# Patient Record
Sex: Female | Born: 1955 | Race: White | Hispanic: No | Marital: Married | Smoking: Never smoker
Health system: Southern US, Community
[De-identification: ages and names within clinical notes are randomized; demographics above are authoritative.]

## PROBLEM LIST (undated history)

## (undated) DIAGNOSIS — E079 Disorder of thyroid, unspecified: Secondary | ICD-10-CM

## (undated) DIAGNOSIS — I82409 Acute embolism and thrombosis of unspecified deep veins of unspecified lower extremity: Secondary | ICD-10-CM

## (undated) DIAGNOSIS — I10 Essential (primary) hypertension: Secondary | ICD-10-CM

## (undated) HISTORY — DX: Disorder of thyroid, unspecified: E07.9

## (undated) HISTORY — DX: Essential (primary) hypertension: I10

## (undated) HISTORY — DX: Acute embolism and thrombosis of unspecified deep veins of unspecified lower extremity: I82.409

---
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
## 1988-01-21 HISTORY — PX: CHOLECYSTECTOMY: SHX55

## 1988-01-21 HISTORY — PX: APPENDECTOMY: SHX54

## 1995-01-21 HISTORY — PX: BREAST LUMPECTOMY: SHX2

## 1996-01-21 HISTORY — PX: ABDOMINAL HYSTERECTOMY: SHX81

## 2004-01-21 HISTORY — PX: LAPAROSCOPIC GASTRIC BANDING: SHX1100

## 2006-05-29 ENCOUNTER — Ambulatory Visit (HOSPITAL_COMMUNITY): Admission: RE | Admit: 2006-05-29 | Discharge: 2006-05-29 | Payer: Self-pay | Admitting: Surgery

## 2006-06-02 ENCOUNTER — Ambulatory Visit (HOSPITAL_COMMUNITY): Admission: RE | Admit: 2006-06-02 | Discharge: 2006-06-02 | Payer: Self-pay | Admitting: Surgery

## 2006-06-11 ENCOUNTER — Encounter: Admission: RE | Admit: 2006-06-11 | Discharge: 2006-06-11 | Payer: Self-pay | Admitting: Surgery

## 2006-10-06 ENCOUNTER — Encounter: Admission: RE | Admit: 2006-10-06 | Discharge: 2007-01-04 | Payer: Self-pay | Admitting: Surgery

## 2006-10-20 ENCOUNTER — Ambulatory Visit (HOSPITAL_COMMUNITY): Admission: RE | Admit: 2006-10-20 | Discharge: 2006-10-21 | Payer: Self-pay | Admitting: Surgery

## 2006-10-21 ENCOUNTER — Ambulatory Visit: Payer: Self-pay | Admitting: Vascular Surgery

## 2006-10-29 ENCOUNTER — Ambulatory Visit (HOSPITAL_COMMUNITY): Admission: RE | Admit: 2006-10-29 | Discharge: 2006-10-29 | Payer: Self-pay | Admitting: Radiation Oncology

## 2007-01-25 ENCOUNTER — Encounter: Admission: RE | Admit: 2007-01-25 | Discharge: 2007-01-25 | Payer: Self-pay | Admitting: Surgery

## 2007-04-05 ENCOUNTER — Encounter: Admission: RE | Admit: 2007-04-05 | Discharge: 2007-04-05 | Payer: Self-pay | Admitting: Surgery

## 2007-07-30 ENCOUNTER — Encounter: Admission: RE | Admit: 2007-07-30 | Discharge: 2007-07-30 | Payer: Self-pay | Admitting: Surgery

## 2007-11-04 ENCOUNTER — Ambulatory Visit (HOSPITAL_BASED_OUTPATIENT_CLINIC_OR_DEPARTMENT_OTHER): Admission: RE | Admit: 2007-11-04 | Discharge: 2007-11-04 | Payer: Self-pay | Admitting: Obstetrics and Gynecology

## 2008-05-09 ENCOUNTER — Ambulatory Visit: Payer: Self-pay | Admitting: Radiology

## 2008-05-09 ENCOUNTER — Emergency Department (HOSPITAL_BASED_OUTPATIENT_CLINIC_OR_DEPARTMENT_OTHER): Admission: EM | Admit: 2008-05-09 | Discharge: 2008-05-09 | Payer: Self-pay | Admitting: Emergency Medicine

## 2008-05-21 IMAGING — CT CT ANGIO CHEST
2 of 4 series · 19 of 36 positions shown · IV contrast (APPLIED)
Comparison: Chest radiograph 10/16/06.

CLINICAL DATA: 50-year-old female with right pleuritic chest pain.  History of DVT 20 years ago.  Status-post gastric banding 9 days ago.  
CT ANGIOGRAPHY OF CHEST:
TECHNIQUE: Multidetector CT imaging of the chest was performed during bolus injection of intravenous contrast.  Multiplanar CT angiographic image reconstructions were generated to evaluate the vascular anatomy.
Contrast:  80 cc Omnipaque 300

[Series 6: pe 1.0 b40f thins for pacs · axial · 0.74mm/px · z∈[-322,-124]mm · 16 of 219 slices shown]
[im 11/219  lung]
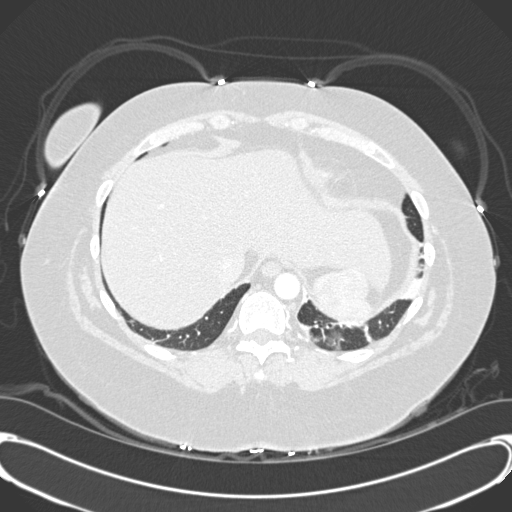
[im 22/219  mediastinal]
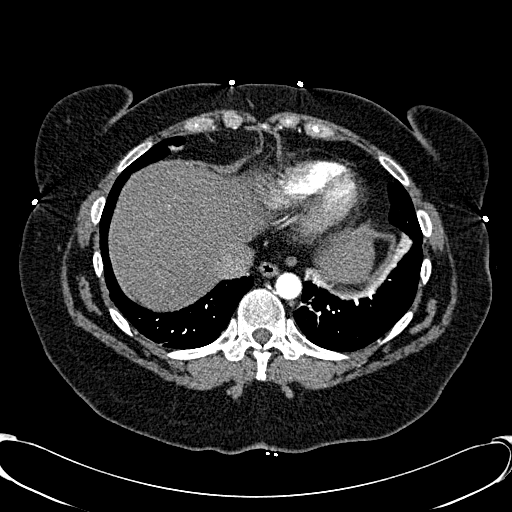
[im 33/219  lung]
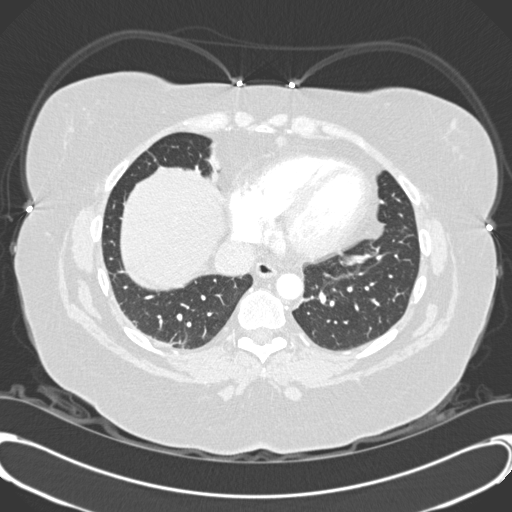
[im 55/219  mediastinal]
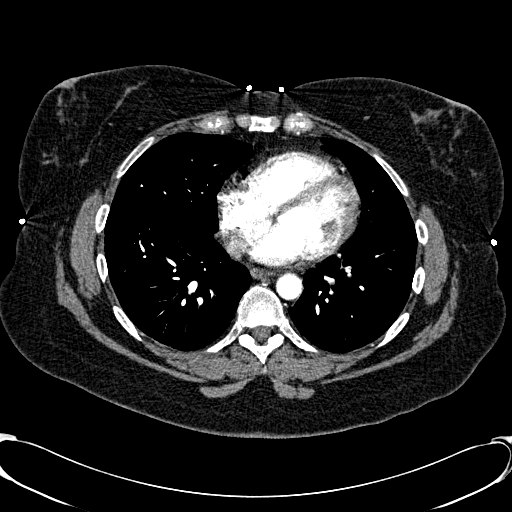
[im 66/219  lung]
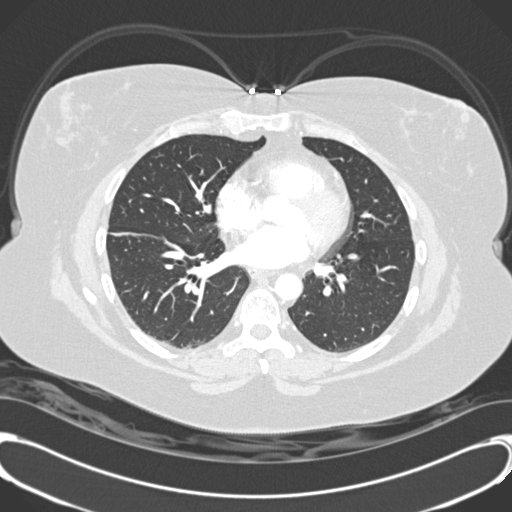
[im 77/219  mediastinal]
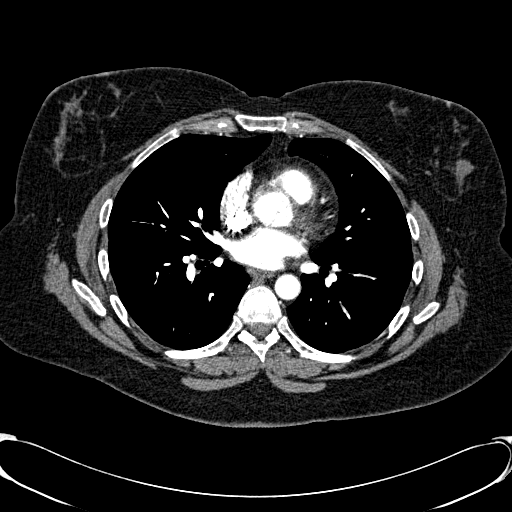
[im 88/219  lung]
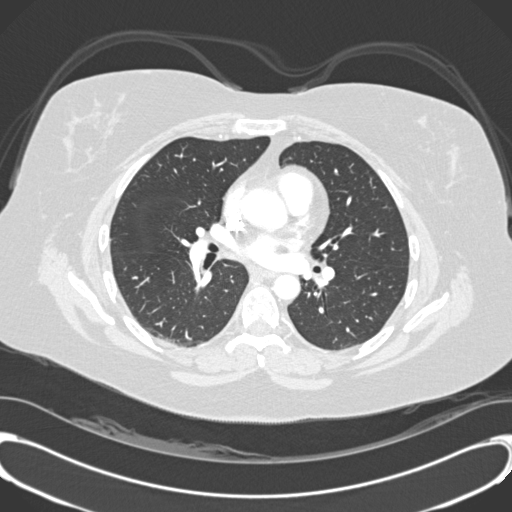
[im 99/219  mediastinal]
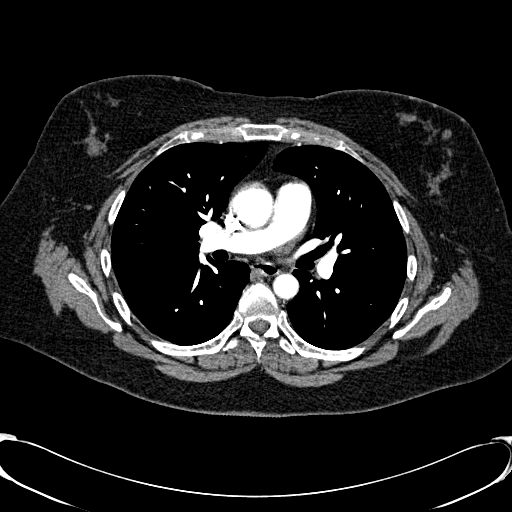
[im 120/219  lung]
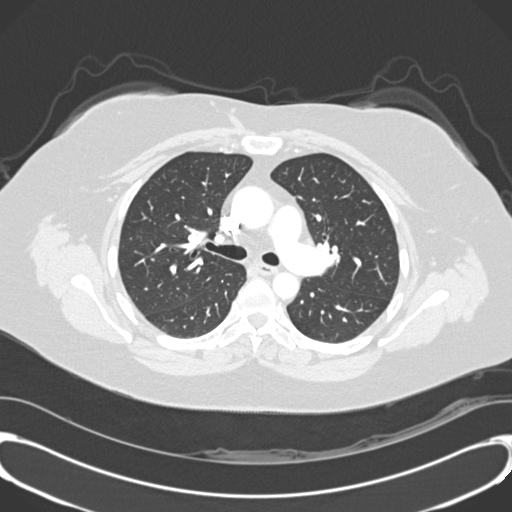
[im 131/219  mediastinal]
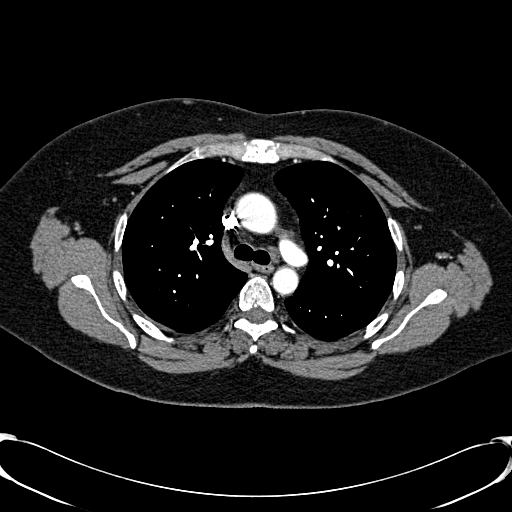
[im 142/219  lung]
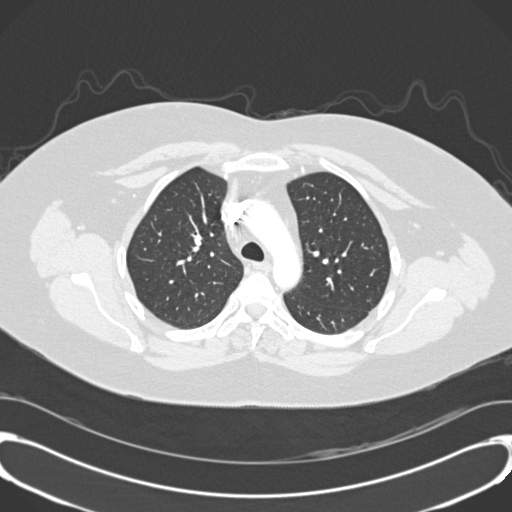
[im 153/219  mediastinal]
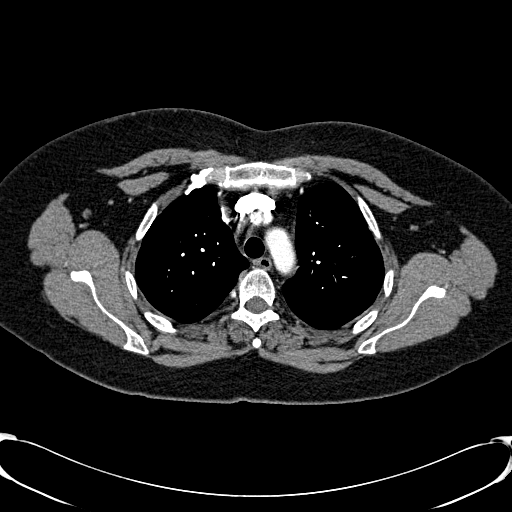
[im 164/219  lung]
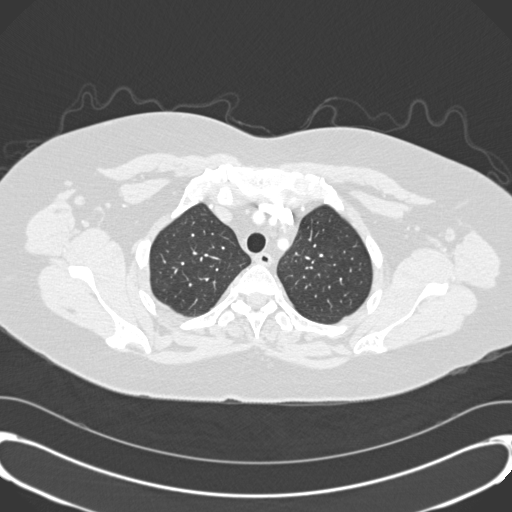
[im 186/219  mediastinal]
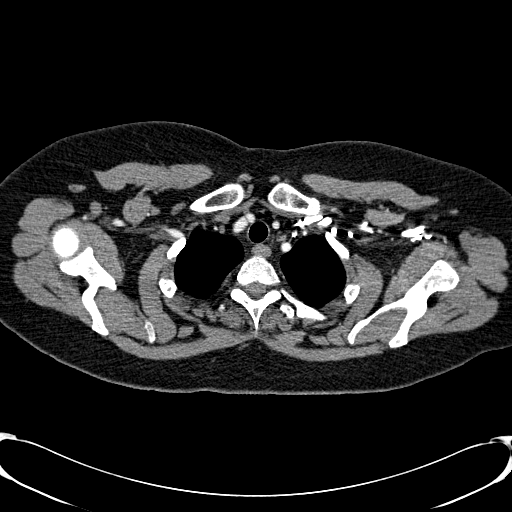
[im 197/219  lung]
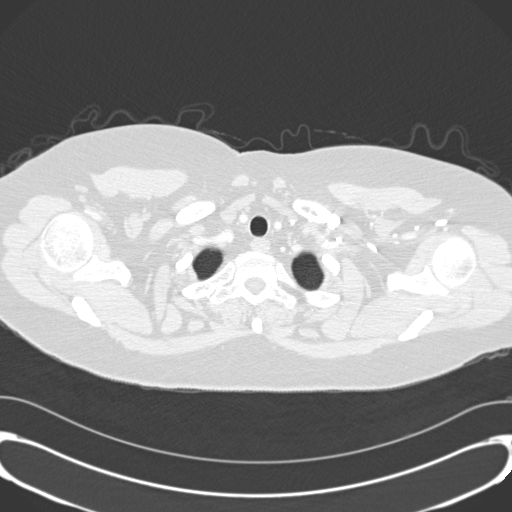
[im 208/219  mediastinal]
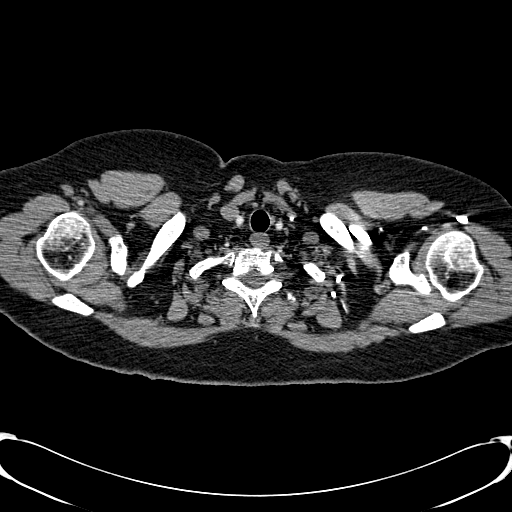

[Series 602: <mpr thick range> · coronal · 0.74mm/px · 3 of 60 slices shown]
[im 12/60  mediastinal]
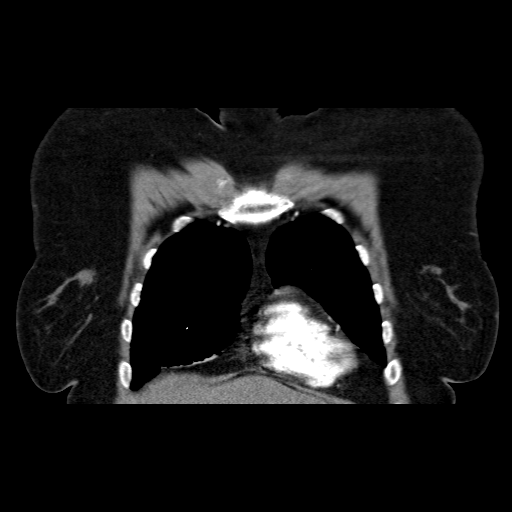
[im 24/60  mediastinal]
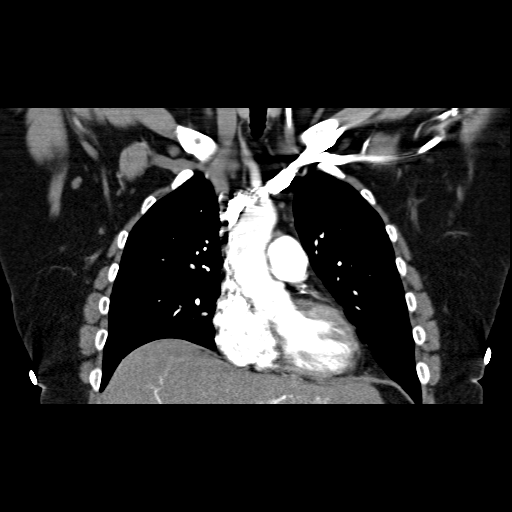
[im 36/60  mediastinal]
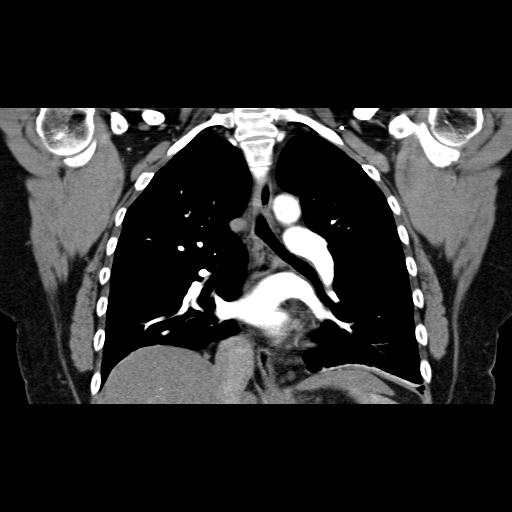

[19 of 36 positions shown; findings below may reference images not displayed]

FINDINGS: Good contrast bolus timing in the pulmonary arterial tree.  No focal filling defect is identified within the pulmonary arterial tree to suggest pulmonary embolus.  
There is no axillary, mediastinal lymphadenopathy, pericardial or pleural effusion.  An AP window node measures 7.6 mm in short axis.  Visualized upper abdominal viscera are within normal limits.  The other major vascular structures in the mediastinum are within normal limits.  There is mild dependent atelectasis.  There is a 3 mm pulmonary nodule in the superior segment of the right lower lobe.  There is dependent atelectasis at the left lung base.  The lungs are otherwise clear.  The major airways are patent.  Breast parenchyma appears relatively symmetric bilaterally.
IMPRESSION: 1.  No evidence of pulmonary embolus. 
2.  Solitary 3 mm pulmonary nodule in the superior segment of the right lower lobe.  ecommend repeat chest CT in 12 months for further evaluation.

## 2010-06-04 NOTE — Op Note (Signed)
NAMEJANISHA, Tami Burke                 ACCOUNT NO.:  [id number]   MEDICAL RECORD NO.:  [id number]          PATIENT TYPE:  OIB   LOCATION:  1530                         FACILITY:  [facility]   PHYSICIAN:  Thornton Park. Daphine Deutscher, MD  DATE OF BIRTH:  18-Jul-1955   DATE OF PROCEDURE:  10/20/2006  DATE OF DISCHARGE:                               OPERATIVE REPORT   CCS number [id number].   PREOPERATIVE DIAGNOSIS:  Morbid obesity BMI of 40.  Comorbidities  including steatohepatitis, diabetes mellitus, diet controlled.   PROCEDURE:  Laparoscopic placement of adjustable gastric band APS  (Allergan).   SURGEON:  Thornton Park. Daphine Deutscher, MD   ASSISTANT:  Baruch Merl, MD   ANESTHESIA:  General endotracheal.   DESCRIPTION OF PROCEDURE:  Tami Burke 50-year lady taken to room 1 at  [facility], given general anesthesia.  The abdomen was prepped with  Techni-Care and draped sterilely.  I entered the abdomen through the  left upper quadrant using the [employer] FIOS system without  difficulty insufflating the abdomen.  Standard port placement used  including the 15 port down the right lower position.  The 5 mm port  above was used for introduction of the Nathanson retractor which was  placed beneath the liver exposing the esophagogastric junction.  I used  dissection using the hook cautery over on the left side near the left  crus and then went over and found a port entrance site on the right crus  down near the fat stripe.  I entered the space with a band passer which  slid across easily and then it came up readily.   The APS band was then inserted without difficulty and threaded through  the band passer and pulled around the stomach and belted in place.  The  calibration tubing was passed and was passed the band in place.   The band tubing was removed and the band was plicated with three sutures  using the Surgidac and the tie knots to secure this.  Good band  placement was felt to be present.  I then  brought it out into the 15 mm  port site, cut off the tip, let that fluid flow out.  Meantime I created  a port site on the fascia and placed the Prolene sutures and then  threaded 2-0 Prolenes up into the port after connecting it to the band  and then tied it down.  It lay nicely on the fascia.   The wounds were irrigated with saline and were closed with 4-0 Vicryl  and Dermabond.  The patient tolerated procedure well, was taken to  recovery room in satisfactory condition.      Thornton Park Daphine Deutscher, MD  Electronically Signed     MBM/MEDQ  D:  10/20/2006  T:  10/21/2006  Job:  [id number]   cc:   Dr Oren Bracket [place]

## 2010-10-31 LAB — COMPREHENSIVE METABOLIC PANEL
AST: 173 — ABNORMAL HIGH
Albumin: 4
BUN: 6
CO2: 29
Calcium: 9.9
Chloride: 99
Creatinine, Ser: 0.77
GFR calc Af Amer: >60
GFR calc non Af Amer: >60
Glucose, Bld: 120 — ABNORMAL HIGH
Sodium: 139
Total Bilirubin: 0.7

## 2010-10-31 LAB — CBC
HCT: 33.5 — ABNORMAL LOW
Hemoglobin: 11.1 — ABNORMAL LOW
Platelets: 208
WBC: 8.8

## 2010-10-31 LAB — DIFFERENTIAL
Eosinophils Absolute: 0
Eosinophils Relative: 0
Monocytes Absolute: 0.6
Neutrophils Relative %: 75

## 2011-04-02 ENCOUNTER — Telehealth (INDEPENDENT_AMBULATORY_CARE_PROVIDER_SITE_OTHER): Payer: Self-pay | Admitting: Surgery

## 2011-04-04 NOTE — Telephone Encounter (Deleted)
Scheduled the patient an appt on 05/08/11 with Mardelle Matte for a fill

## 2011-04-04 NOTE — Telephone Encounter (Signed)
Offered the patient an appt with Mardelle Matte on 05/08/11 for a fill, she did not want to keep this appt because she feels she needs an appetite suppressant instead of a band fill. Offered her to come in to the clinic and see Dr Daphine Deutscher for evaluation and possibly getting her back to see the nutritionist, the patient states she has some back problems and is unable to exersice therefore has gained around 20lbs. The patient stated she would call back to schedule.

## 2011-05-08 ENCOUNTER — Encounter (INDEPENDENT_AMBULATORY_CARE_PROVIDER_SITE_OTHER): Payer: Self-pay

## 2012-05-10 ENCOUNTER — Telehealth (INDEPENDENT_AMBULATORY_CARE_PROVIDER_SITE_OTHER): Payer: Self-pay

## 2012-05-10 NOTE — Telephone Encounter (Signed)
The pt called and states she has no cartilage in her knee.  She got an injection in her knee.  Her dr wants to prescribe Celebrex.  The pt had lap band surgery 5 years ago by Dr Daphine Deutscher.  She wanted to know if she can take Celebrex.  I advised against it.  I said she can take Tylenol for pain.  She did not realize that.  I told her no Aspirin, Advil or Antiinflammatories.  She wants to know if there is a suppository medication that she can be given that won't hurt her.  I did not know.  I told her I will send a message to Dr Daphine Deutscher and [place].  You can call her back on her home # and leave a message.

## 2012-05-12 NOTE — Telephone Encounter (Signed)
Patient called again to ask about taking Celebrex.  Informed her we were still awaiting a response from [facility] MD.

## 2012-05-13 ENCOUNTER — Telehealth (INDEPENDENT_AMBULATORY_CARE_PROVIDER_SITE_OTHER): Payer: Self-pay

## 2012-05-13 NOTE — Telephone Encounter (Signed)
LMOM letting pt know that MM has said NO to the Celebrex.  Also stated that MM suggested using a topical cream such has Voltaren gel/cream.

## 2012-12-02 ENCOUNTER — Ambulatory Visit (INDEPENDENT_AMBULATORY_CARE_PROVIDER_SITE_OTHER): Payer: Managed Care, Other (non HMO) | Admitting: Physician Assistant

## 2012-12-02 ENCOUNTER — Encounter (INDEPENDENT_AMBULATORY_CARE_PROVIDER_SITE_OTHER): Payer: Self-pay

## 2012-12-02 VITALS — BP 140/80 | HR 72 | Resp 16 | Ht 64.0 in | Wt 180.8 lb

## 2012-12-02 DIAGNOSIS — Z4651 Encounter for fitting and adjustment of gastric lap band: Secondary | ICD-10-CM

## 2012-12-02 NOTE — Patient Instructions (Signed)

## 2012-12-02 NOTE — Progress Notes (Signed)
  HISTORY: Tami Burke is a 57 y.o.female who received an AP-Standard lap-band in September 2008 by Dr. Daphine Deutscher. She comes in having last been seen in January 2011. She's gained 23 lbs. She had an episode of nausea and vomiting earlier this year and had severe epigastric pain which self-resolved. She had a similar episode after sneezing a couple of months ago. She has noticed over the past several months that she's able to tolerate foods that were intolerable in the past including rice and bread. She had no sudden changes in food tolerances.  VITAL SIGNS: Filed Vitals:   12/02/12 1029  BP: 140/80  Pulse: 72  Resp: 16    PHYSICAL EXAM: Physical exam reveals a very well-appearing 57 y.o.female in no apparent distress Neurologic: Awake, alert, oriented Psych: Bright affect, conversant Respiratory: Breathing even and unlabored. No stridor or wheezing Abdomen: Soft, nontender, nondistended to palpation. Incisions well-healed. No incisional hernias. Port easily palpated. Extremities: Atraumatic, good range of motion.  ASSESMENT: 57 y.o.  female  s/p AP-Standard lap-band.   PLAN: The patient's port was accessed with a 20G Huber needle without difficulty. Clear fluid was aspirated and 0.5 mL saline was added to the port to give a total predicted volume of 6 mL after confirming the presence of 5.5 mL in the band. The patient was able to swallow water without difficulty following the procedure and was instructed to take clear liquids for the next 24-48 hours and advance slowly as tolerated. I've ordered a KUB to evaluate band position. We'll have her back in two months or sooner if needed.

## 2012-12-08 ENCOUNTER — Ambulatory Visit
Admission: RE | Admit: 2012-12-08 | Discharge: 2012-12-08 | Disposition: A | Discharge status: Home / Self Care | Payer: Managed Care, Other (non HMO) | Source: Ambulatory Visit | Attending: Physician Assistant | Admitting: Physician Assistant

## 2012-12-08 DIAGNOSIS — Z4651 Encounter for fitting and adjustment of gastric lap band: Secondary | ICD-10-CM

## 2012-12-22 ENCOUNTER — Telehealth (INDEPENDENT_AMBULATORY_CARE_PROVIDER_SITE_OTHER): Payer: Self-pay | Admitting: General Surgery

## 2012-12-22 NOTE — Telephone Encounter (Signed)
Pt called for KUB results from lap band clinic on 12/02/12.  Shows movement of lap band.  No nausea or vomiting, only weight gain.  Updated Dr. Daphine Deutscher, who wants her to come in for a fill at next clinic.  appt made for tomorrow at 3:45.

## 2012-12-23 ENCOUNTER — Encounter (INDEPENDENT_AMBULATORY_CARE_PROVIDER_SITE_OTHER): Payer: Managed Care, Other (non HMO)

## 2012-12-23 ENCOUNTER — Ambulatory Visit (INDEPENDENT_AMBULATORY_CARE_PROVIDER_SITE_OTHER): Payer: Managed Care, Other (non HMO) | Admitting: Physician Assistant

## 2012-12-23 ENCOUNTER — Encounter (INDEPENDENT_AMBULATORY_CARE_PROVIDER_SITE_OTHER): Payer: Self-pay

## 2012-12-23 VITALS — BP 130/72 | HR 72 | Temp 97.2°F | Resp 18 | Ht 64.0 in | Wt 177.6 lb

## 2012-12-23 DIAGNOSIS — Z4651 Encounter for fitting and adjustment of gastric lap band: Secondary | ICD-10-CM

## 2012-12-23 NOTE — Progress Notes (Signed)
  HISTORY: Tami Burke is a 57 y.o.female who received an AP-Standard lap-band in September 2008 by Dr. Daphine Deutscher. She comes in with three pounds of weight loss since her last visit in November. She has no regurgitation or reflux symptoms. She reports no significant change in restriction but she reports "really watching what I'm eating." She would like a fill today.  VITAL SIGNS: Filed Vitals:   12/23/12 1441  BP: 130/72  Pulse: 72  Temp: 97.2 F (36.2 C)  Resp: 18    PHYSICAL EXAM: Physical exam reveals a very well-appearing 57 y.o.female in no apparent distress Neurologic: Awake, alert, oriented Psych: Bright affect, conversant Respiratory: Breathing even and unlabored. No stridor or wheezing Abdomen: Soft, nontender, nondistended to palpation. Incisions well-healed. No incisional hernias. Port easily palpated. Extremities: Atraumatic, good range of motion.  ASSESMENT: 57 y.o.  female  s/p AP-Standard lap-band.   PLAN: The patient's port was accessed with a 20G Huber needle without difficulty. Clear fluid was aspirated and 0.5 mL saline was added to the port to give a total predicted volume of 6.5 mL. The patient was able to swallow water without difficulty following the procedure and was instructed to take clear liquids for the next 24-48 hours and advance slowly as tolerated. She was doubtful as to the efficacy of this fill volume but I reassured her that 6.5 mL was not an insignificant volume for a standard band. We have her scheduled to return in one month for re-evaluation or sooner if needed.

## 2012-12-23 NOTE — Patient Instructions (Signed)

## 2013-02-03 ENCOUNTER — Encounter (INDEPENDENT_AMBULATORY_CARE_PROVIDER_SITE_OTHER): Payer: Managed Care, Other (non HMO)

## 2013-03-31 ENCOUNTER — Encounter (INDEPENDENT_AMBULATORY_CARE_PROVIDER_SITE_OTHER): Payer: Self-pay | Admitting: Surgery

## 2013-03-31 ENCOUNTER — Ambulatory Visit (INDEPENDENT_AMBULATORY_CARE_PROVIDER_SITE_OTHER): Payer: Managed Care, Other (non HMO) | Admitting: Surgery

## 2013-03-31 VITALS — BP 120/86 | HR 62 | Temp 98.4°F | Resp 14 | Ht 64.0 in | Wt 164.8 lb

## 2013-03-31 DIAGNOSIS — Z9884 Bariatric surgery status: Secondary | ICD-10-CM | POA: Insufficient documentation

## 2013-03-31 NOTE — Progress Notes (Signed)
Lapband Fill Encounter Problem List:   Patient Active Problem List   Diagnosis Date Noted  . Lapband APS 2008 03/31/2013    Felecia ShellingWendy Vantuyl Body mass index is 28.27 kg/(m^2). Weight loss since surgery  55  Having regurgitation?:  no  Feel that they need a fill?  No sure  Nocturnal reflux?  maybe  Amount of fill  0     Instructions given and weight loss goals discussed.    Band orientation went from near horizontal to the 2-8 position.  She has noticed a change in the dysphagia that she gets.  Will obtain an UGI to look at her band  Will see back after UGI  Matt B. Daphine DeutscherMartin, MD, FACS

## 2013-04-01 ENCOUNTER — Telehealth (INDEPENDENT_AMBULATORY_CARE_PROVIDER_SITE_OTHER): Payer: Self-pay | Admitting: *Deleted

## 2013-04-01 NOTE — Telephone Encounter (Signed)
I spoke with pt and informed her of the appt for her UGI at [facility] on 04/05/13 with an arrival time of 7:45am.  I instructed pt to be NPO after midnight. She is agreeable with all instructions given.

## 2013-04-05 ENCOUNTER — Ambulatory Visit
Admission: RE | Admit: 2013-04-05 | Discharge: 2013-04-05 | Disposition: A | Discharge status: Home / Self Care | Payer: Managed Care, Other (non HMO) | Source: Ambulatory Visit | Attending: Surgery | Admitting: Surgery

## 2013-04-05 DIAGNOSIS — Z9884 Bariatric surgery status: Secondary | ICD-10-CM

## 2013-04-13 ENCOUNTER — Encounter (INDEPENDENT_AMBULATORY_CARE_PROVIDER_SITE_OTHER): Payer: Managed Care, Other (non HMO) | Admitting: Surgery

## 2013-05-18 ENCOUNTER — Encounter (INDEPENDENT_AMBULATORY_CARE_PROVIDER_SITE_OTHER): Payer: Self-pay | Admitting: Surgery

## 2013-05-18 ENCOUNTER — Ambulatory Visit (INDEPENDENT_AMBULATORY_CARE_PROVIDER_SITE_OTHER): Payer: Managed Care, Other (non HMO) | Admitting: Surgery

## 2013-05-18 VITALS — BP 105/65 | HR 68 | Temp 97.8°F | Resp 14 | Ht 64.0 in | Wt 161.4 lb

## 2013-05-18 DIAGNOSIS — Z4651 Encounter for fitting and adjustment of gastric lap band: Secondary | ICD-10-CM

## 2013-05-18 NOTE — Progress Notes (Signed)
Lapband Fill Encounter Problem List:   Patient Active Problem List   Diagnosis Date Noted  . Lapband APS 2008 03/31/2013    Tami Burke Body mass index is 27.69 kg/(m^2). Weight loss since surgery  58.5  Having regurgitation?:  no  Feel that they need a fill?  yes  Nocturnal reflux?  no  Amount of fill  0.5     Instructions given and weight loss goals discussed.    She is 7 years out.  Some of her friends have had sleeves.  Her UGI she saw and I reviewed.  We decided to add .5 to give her a little more restriction.    Matt B. Daphine DeutscherMartin, MD, FACS

## 2013-05-18 NOTE — Patient Instructions (Signed)

## 2020-01-30 ENCOUNTER — Other Ambulatory Visit: Payer: Self-pay

## 2020-01-30 ENCOUNTER — Ambulatory Visit (INDEPENDENT_AMBULATORY_CARE_PROVIDER_SITE_OTHER): Payer: Medicare Other

## 2020-01-30 ENCOUNTER — Ambulatory Visit (INDEPENDENT_AMBULATORY_CARE_PROVIDER_SITE_OTHER): Payer: Medicare Other | Admitting: Emergency Medicine

## 2020-01-30 ENCOUNTER — Encounter: Payer: Self-pay | Admitting: Emergency Medicine

## 2020-01-30 DIAGNOSIS — R053 Chronic cough: Secondary | ICD-10-CM

## 2020-01-30 MED ORDER — LORATADINE 10 MG PO TABS: 10.0000 mg | ORAL_TABLET | Freq: Every day | ORAL | 11 refills | Status: DC

## 2020-01-30 MED ORDER — OMEPRAZOLE 20 MG PO CPDR: 20.0000 mg | DELAYED_RELEASE_CAPSULE | Freq: Two times a day (BID) | ORAL | 11 refills | Status: AC

## 2020-01-30 NOTE — Progress Notes (Signed)
Subjective:    Patient ID: Tami Burke, female    DOB: 20-May-1955, 65 y.o.   MRN: [id number]  HPI 65 year old never smoker with a history of hypertension, hypothyroidism, remote lower extremity DVT on OCP, not currently on anticoagulation.  Here to evaluate chronic cough.   Seen by Pulmonology at [facility] 11/29/2019 for was deemed to be post infectious chronic cough following respiratory infection 08/2019 that included fever, chills, cough, dyspnea.  CT chest 09/27/2019 reviewed by me showed no PE, patchy and groundglass left lower lobe opacities with some slight consolidation, as well as minimal patchy opacity in the medial right lower lobe most consistent with pneumonia.  Her COVID-19 was negative at the time.  There was a 3 mm right superior segmental pulmonary nodule that was stable going back to 2009.  She was treated with antibiotics and 5-day course of corticosteroids in September.  She has continued to have cough and has been treated with antibiotics x2, prednisone x2.   She describes persistent cough, sometimes paroxysms that result in emesis. Sometimes prod of clear / white, never blood, sometimes dry. She has a change in her voice quality, sometimes hoarse and loses her voice.  She is also experiencing . She has some low level nasal and sinus congestion. Denies any GERD sx.   Repeat chest x-ray 01/02/2020 reviewed by me showed no infiltrates   Review of Systems As per HPI  Past Medical History:  Diagnosis Date  . DVT, lower extremity (HCC)   . Hypertension   . Thyroid disease    hypothyroidism     Family History  Problem Relation Age of Onset  . Cancer Mother        colon/rectal  . Heart disease Mother   . Heart disease Maternal Grandmother      Social History   Socioeconomic History  . Marital status: Married    Spouse name: Not on file  . Number of children: Not on file  . Years of education: Not on file  . Highest education level: Not on file  Occupational  History  . Not on file  Tobacco Use  . Smoking status: Never Smoker  . Smokeless tobacco: Never Used  Substance and Sexual Activity  . Alcohol use: No  . Drug use: No  . Sexual activity: Not on file  Other Topics Concern  . Not on file  Social History Narrative  . Not on file   Social Determinants of Health   Financial Resource Strain: Not on file  Food Insecurity: Not on file  Transportation Needs: Not on file  Physical Activity: Not on file  Stress: Not on file  Social Connections: Not on file  Intimate Partner Violence: Not on file  No occupational exposures No birds No fungal exposure  Allergies  Allergen Reactions  . Penicillins Hives, Itching and Swelling     Outpatient Medications Prior to Visit  Medication Sig Dispense Refill  . albuterol (VENTOLIN HFA) 108 (90 Base) MCG/ACT inhaler Inhale 1-2 puffs into the lungs every 6 (six) hours as needed for wheezing or shortness of breath.    . Calcium Carb-Cholecalciferol (CALCIUM 1000 + D) 1000-800 MG-UNIT TABS Take 1 tablet by mouth daily.    Marland Kitchen levothyroxine (SYNTHROID) 100 MCG tablet Take by mouth.    . DOXYCYCLINE HYCLATE PO Take 100 mg by mouth. (Patient not taking: Reported on 01/30/2020)    . levothyroxine (SYNTHROID, LEVOTHROID) 88 MCG tablet Take 88 mcg by mouth daily before breakfast.    .  lisinopril (PRINIVIL,ZESTRIL) 20 MG tablet Take 20 mg by mouth daily. (Patient not taking: Reported on 01/30/2020)    . traZODone (DESYREL) 50 MG tablet  (Patient not taking: Reported on 01/30/2020)     No facility-administered medications prior to visit.         Objective:   Physical Exam  Vitals:   01/30/20 1004  BP: (!) 150/90  Pulse: 97  Temp: 98.1 F (36.7 C)  TempSrc: Temporal  SpO2: 97%  Weight: 187 lb 6.4 oz (85 kg)  Height: 5\' 4"  (1.626 m)   Gen: Pleasant, overwt woman, in no distress,  normal affect  ENT: No lesions,  mouth clear,  oropharynx clear, no postnasal drip, slightly hoarse voice  Neck: No  JVD, end-insp squeak neck  Lungs: No use of accessory muscles, no crackles or wheezing on normal respiration, no wheeze on forced expiration but she does cough  Cardiovascular: RRR, early syst M, no peripheral edema  Musculoskeletal: No deformities, no cyanosis or clubbing  Neuro: alert, awake, non focal  Skin: Warm, no lesions or rash     Assessment & Plan:  Chronic cough Principally upper airway in nature, but there may be a component of asthma here since she does get some relief from albuterol.  We will perform pulmonary function testing, repeat her chest x-ray.  I will try to treat GERD and allergic rhinitis empirically.  Discussed avoiding throat clearing.  Chest x-ray today We will arrange for pulmonary function testing and next office visit Please try starting omeprazole 20 mg twice a day until next visit.  Take this medication 1 hour around food. Please try starting loratadine 10 mg once daily until next visit. Do your best to avoid throat clearing if possible.  When he had the urge to clear your throat, relax and just swallow instead.  You may want to try using a nonmentholated cough drops or sugar-free candy to help you with this. Follow with Dr. next available with full pulmonary function testing on the same day.  Delton Coombes, MD, PhD 01/30/2020, 10:40 AM [facility] [phone] or if no answer [phone]

## 2020-01-30 NOTE — Patient Instructions (Signed)
Chest x-ray today We will arrange for pulmonary function testing and next office visit Please try starting omeprazole 20 mg twice a day until next visit.  Take this medication 1 hour around food. Please try starting loratadine 10 mg once daily until next visit. Do your best to avoid throat clearing if possible.  When he had the urge to clear your throat, relax and just swallow instead.  You may want to try using a nonmentholated cough drops or sugar-free candy to help you with this. Follow with Dr. Delton Coombes next available with full pulmonary function testing on the same day.

## 2020-01-30 NOTE — Assessment & Plan Note (Signed)
Principally upper airway in nature, but there may be a component of asthma here since she does get some relief from albuterol.  We will perform pulmonary function testing, repeat her chest x-ray.  I will try to treat GERD and allergic rhinitis empirically.  Discussed avoiding throat clearing.  Chest x-ray today We will arrange for pulmonary function testing and next office visit Please try starting omeprazole 20 mg twice a day until next visit.  Take this medication 1 hour around food. Please try starting loratadine 10 mg once daily until next visit. Do your best to avoid throat clearing if possible.  When he had the urge to clear your throat, relax and just swallow instead.  You may want to try using a nonmentholated cough drops or sugar-free candy to help you with this. Follow with Dr. Delton Coombes next available with full pulmonary function testing on the same day.

## 2020-01-30 NOTE — Addendum Note (Signed)
Addended by: Delrae Rend on: 01/30/2020 10:54 AM   Modules accepted: Orders

## 2020-01-30 NOTE — Addendum Note (Signed)
Addended by: Delrae Rend on: 01/30/2020 10:58 AM   Modules accepted: Orders

## 2020-02-21 ENCOUNTER — Other Ambulatory Visit: Payer: Self-pay

## 2020-02-21 ENCOUNTER — Ambulatory Visit: Payer: Medicare Other | Admitting: Emergency Medicine

## 2020-02-23 ENCOUNTER — Other Ambulatory Visit (HOSPITAL_COMMUNITY)
Admission: RE | Admit: 2020-02-23 | Discharge: 2020-02-23 | Disposition: A | Discharge status: Home / Self Care | Payer: Medicare Other | Source: Ambulatory Visit | Attending: Emergency Medicine | Admitting: Emergency Medicine

## 2020-02-23 DIAGNOSIS — Z20822 Contact with and (suspected) exposure to covid-19: Secondary | ICD-10-CM | POA: Insufficient documentation

## 2020-02-23 DIAGNOSIS — Z01812 Encounter for preprocedural laboratory examination: Secondary | ICD-10-CM | POA: Insufficient documentation

## 2020-02-23 LAB — SARS CORONAVIRUS 2 (TAT 6-24 HRS): SARS Coronavirus 2: NEGATIVE

## 2020-02-24 ENCOUNTER — Other Ambulatory Visit (HOSPITAL_COMMUNITY): Payer: Managed Care, Other (non HMO)

## 2020-02-24 ENCOUNTER — Ambulatory Visit: Payer: Managed Care, Other (non HMO) | Admitting: Primary Care

## 2020-02-27 ENCOUNTER — Other Ambulatory Visit: Payer: Self-pay

## 2020-02-27 ENCOUNTER — Ambulatory Visit (INDEPENDENT_AMBULATORY_CARE_PROVIDER_SITE_OTHER): Payer: Medicare Other | Admitting: Emergency Medicine

## 2020-02-27 ENCOUNTER — Encounter: Payer: Self-pay | Admitting: Emergency Medicine

## 2020-02-27 VITALS — BP 118/78 | HR 92 | Temp 97.2°F | Ht 63.0 in | Wt 186.0 lb

## 2020-02-27 DIAGNOSIS — R918 Other nonspecific abnormal finding of lung field: Secondary | ICD-10-CM

## 2020-02-27 DIAGNOSIS — R053 Chronic cough: Secondary | ICD-10-CM | POA: Diagnosis not present

## 2020-02-27 LAB — PULMONARY FUNCTION TEST
DL/VA % pred: 129 %
DL/VA: 5.4 ml/min/mmHg/L
DLCO cor % pred: 92 %
DLCO cor: 18.1 ml/min/mmHg
DLCO unc % pred: 92 %
DLCO unc: 18.1 ml/min/mmHg
FEF 25-75 Post: 2.2 L/sec
FEF 25-75 Pre: 1.51 L/sec
FEF2575-%Change-Post: 45 %
FEF2575-%Pred-Post: 106 %
FEF2575-%Pred-Pre: 72 %
FEV1-%Change-Post: 9 %
FEV1-%Pred-Post: 82 %
FEV1-%Pred-Pre: 75 %
FEV1-Post: 1.95 L
FEV1-Pre: 1.78 L
FEV1FVC-%Change-Post: 5 %
FEV1FVC-%Pred-Pre: 100 %
FEV6-%Change-Post: 3 %
FEV6-%Pred-Post: 80 %
FEV6-%Pred-Pre: 77 %
FEV6-Post: 2.39 L
FEV6-Pre: 2.31 L
FEV6FVC-%Pred-Post: 104 %
FEV6FVC-%Pred-Pre: 104 %
FVC-%Change-Post: 3 %
FVC-%Pred-Post: 77 %
FVC-%Pred-Pre: 74 %
FVC-Post: 2.39 L
FVC-Pre: 2.31 L
Post FEV1/FVC ratio: 81 %
Post FEV6/FVC ratio: 100 %
Pre FEV1/FVC ratio: 77 %
Pre FEV6/FVC Ratio: 100 %
RV % pred: 97 %
RV: 2.04 L
TLC % pred: 85 %
TLC: 4.28 L

## 2020-02-27 MED ORDER — BENZONATATE 100 MG PO CAPS: 100.0000 mg | ORAL_CAPSULE | Freq: Four times a day (QID) | ORAL | 1 refills | Status: AC | PRN

## 2020-02-27 MED ORDER — FLUTICASONE PROPIONATE 50 MCG/ACT NA SUSP: 1.0000 | Freq: Every day | NASAL | 2 refills | Status: AC

## 2020-02-27 NOTE — Assessment & Plan Note (Signed)
We will repeat your CT scan of the chest with contrast Continue loratadine 10 mg once daily. Start fluticasone nasal spray, 2 sprays each nostril once daily. Continue omeprazole 20 mg twice daily.  Take this medication 1 h around food. Continue to avoid throat clearing as best you can. Use Tessalon Perles 200 mg up to every 6 hours if needed for cough suppression. Depending on how your cough progresses, your CT scan results, we may decide to perform bronchoscopy to inspect  upper and lower airways. Follow Dr. Shammond Arave next available after your CT scan so that we can review together. 

## 2020-02-27 NOTE — Progress Notes (Signed)
Subjective:    Patient ID: Tami Burke, female    DOB: Aug 12, 1955, 65 y.o.   MRN: [id number]  HPI 65 year old never smoker with a history of hypertension, hypothyroidism, remote lower extremity DVT on OCP, not currently on anticoagulation.  Here to evaluate chronic cough.   Seen by Pulmonology at [facility] 11/29/2019 for was deemed to be post infectious chronic cough following respiratory infection 08/2019 that included fever, chills, cough, dyspnea.  CT chest 09/27/2019 reviewed by me showed no PE, patchy and groundglass left lower lobe opacities with some slight consolidation, as well as minimal patchy opacity in the medial right lower lobe most consistent with pneumonia.  Her COVID-19 was negative at the time.  There was a 3 mm right superior segmental pulmonary nodule that was stable going back to 2009.  She was treated with antibiotics and 5-day course of corticosteroids in September.  She has continued to have cough and has been treated with antibiotics x2, prednisone x2.   She describes persistent cough, sometimes paroxysms that result in emesis. Sometimes prod of clear / white, never blood, sometimes dry. She has a change in her voice quality, sometimes hoarse and loses her voice.  She is also experiencing . She has some low level nasal and sinus congestion. Denies any GERD sx.   Repeat chest x-ray 01/02/2020 reviewed by me showed no infiltrates  ROV 02/27/20 --follow-up visit for never smoker under evaluation for chronic cough that began following apparent upper respiratory infection in August 2021.  We started omeprazole, loratadine and discussed strategies to avoid upper airway irritation. She is not having GERD, has taken the omeprazole and loratadine. Still with some nasal gtt.   Chest x-ray done 01/30/2020 reviewed by me, shows no infiltrates. Pulmonary function testing done today shows evidence for possible mixed restriction and obstruction without a positive bronchodilator response,  normal lung volumes and normal diffusion capacity.  The curve of the flow-volume loop is normal   Review of Systems As per HPI  Past Medical History:  Diagnosis Date  . DVT, lower extremity (HCC)   . Hypertension   . Thyroid disease    hypothyroidism     Family History  Problem Relation Age of Onset  . Cancer Mother        colon/rectal  . Heart disease Mother   . Heart disease Maternal Grandmother      Social History   Socioeconomic History  . Marital status: Married    Spouse name: Not on file  . Number of children: Not on file  . Years of education: Not on file  . Highest education level: Not on file  Occupational History  . Not on file  Tobacco Use  . Smoking status: Never Smoker  . Smokeless tobacco: Never Used  Substance and Sexual Activity  . Alcohol use: No  . Drug use: No  . Sexual activity: Not on file  Other Topics Concern  . Not on file  Social History Narrative  . Not on file   Social Determinants of Health   Financial Resource Strain: Not on file  Food Insecurity: Not on file  Transportation Needs: Not on file  Physical Activity: Not on file  Stress: Not on file  Social Connections: Not on file  Intimate Partner Violence: Not on file  No occupational exposures No birds No fungal exposure  Allergies  Allergen Reactions  . Penicillins Hives, Itching and Swelling     Outpatient Medications Prior to Visit  Medication Sig Dispense Refill  .  albuterol (VENTOLIN HFA) 108 (90 Base) MCG/ACT inhaler Inhale 1-2 puffs into the lungs every 6 (six) hours as needed for wheezing or shortness of breath.    . Calcium Carb-Cholecalciferol (CALCIUM 1000 + D) 1000-800 MG-UNIT TABS Take 1 tablet by mouth daily.    Marland Kitchen levothyroxine (SYNTHROID) 100 MCG tablet Take by mouth.    Marland Kitchen lisinopril (ZESTRIL) 10 MG tablet Take by mouth.    . loratadine (CLARITIN) 10 MG tablet Take 1 tablet (10 mg total) by mouth daily. 30 tablet 11  . omeprazole (PRILOSEC) 20 MG capsule  Take 1 capsule (20 mg total) by mouth 2 (two) times daily before a meal. 30 capsule 11   No facility-administered medications prior to visit.         Objective:   Physical Exam  Vitals:   02/27/20 1107  BP: 118/78  Pulse: 92  Temp: (!) 97.2 F (36.2 C)  SpO2: 98%  Weight: 186 lb (84.4 kg)  Height: 5\' 3"  (1.6 m)   Gen: Pleasant, overwt woman, in no distress,  normal affect  ENT: No lesions,  mouth clear,  oropharynx clear, no postnasal drip, slightly hoarse voice  Neck: No JVD, end-insp squeak neck  Lungs: No use of accessory muscles, no crackles or wheezing on normal respiration, no wheeze on forced expiration but she does cough  Cardiovascular: RRR, early syst M, no peripheral edema  Musculoskeletal: No deformities, no cyanosis or clubbing  Neuro: alert, awake, non focal  Skin: Warm, no lesions or rash     Assessment & Plan:  Chronic cough We will repeat your CT scan of the chest with contrast Continue loratadine 10 mg once daily. Start fluticasone nasal spray, 2 sprays each nostril once daily. Continue omeprazole 20 mg twice daily.  Take this medication 1 h around food. Continue to avoid throat clearing as best you can. Use Tessalon Perles 200 mg up to every 6 hours if needed for cough suppression. Depending on how your cough progresses, your CT scan results, we may decide to perform bronchoscopy to inspect  upper and lower airways. Follow Dr. next available after your CT scan so that we can review together  Delton Coombes, MD, PhD 02/27/2020, 12:08 PM [facility] [phone] or if no answer [phone]

## 2020-02-27 NOTE — Progress Notes (Signed)
PFT done today. 

## 2020-02-27 NOTE — Patient Instructions (Signed)
We will repeat your CT scan of the chest with contrast Continue loratadine 10 mg once daily. Start fluticasone nasal spray, 2 sprays each nostril once daily. Continue omeprazole 20 mg twice daily.  Take this medication 1 h around food. Continue to avoid throat clearing as best you can. Use Tessalon Perles 200 mg up to every 6 hours if needed for cough suppression. Depending on how your cough progresses, your CT scan results, we may decide to perform bronchoscopy to inspect  upper and lower airways. Follow Dr. Delton Coombes next available after your CT scan so that we can review together.

## 2020-02-27 NOTE — Addendum Note (Signed)
Addended by: Dorisann Frames R on: 02/27/2020 12:12 PM   Modules accepted: Orders

## 2020-02-27 NOTE — Addendum Note (Signed)
Addended by: Dorisann Frames R on: 02/27/2020 12:15 PM   Modules accepted: Orders

## 2020-03-12 ENCOUNTER — Ambulatory Visit
Admission: RE | Admit: 2020-03-12 | Discharge: 2020-03-12 | Disposition: A | Discharge status: Home / Self Care | Payer: Medicare Other | Source: Ambulatory Visit | Attending: Emergency Medicine | Admitting: Emergency Medicine

## 2020-03-12 DIAGNOSIS — R918 Other nonspecific abnormal finding of lung field: Secondary | ICD-10-CM

## 2020-03-12 MED ORDER — IOPAMIDOL (ISOVUE-300) INJECTION 61%
75.0000 mL | Freq: Once | INTRAVENOUS | Status: AC | PRN
  Administered 2020-03-12: 75 mL via INTRAVENOUS

## 2020-04-25 ENCOUNTER — Other Ambulatory Visit: Payer: Self-pay

## 2020-04-25 ENCOUNTER — Ambulatory Visit (INDEPENDENT_AMBULATORY_CARE_PROVIDER_SITE_OTHER): Payer: Medicare Other | Admitting: Emergency Medicine

## 2020-04-25 ENCOUNTER — Encounter: Payer: Self-pay | Admitting: Emergency Medicine

## 2020-04-25 VITALS — BP 112/70 | HR 96 | Temp 97.1°F | Ht 64.0 in | Wt 183.6 lb

## 2020-04-25 DIAGNOSIS — I2584 Coronary atherosclerosis due to calcified coronary lesion: Secondary | ICD-10-CM

## 2020-04-25 DIAGNOSIS — I2510 Atherosclerotic heart disease of native coronary artery without angina pectoris: Secondary | ICD-10-CM | POA: Diagnosis not present

## 2020-04-25 DIAGNOSIS — R053 Chronic cough: Secondary | ICD-10-CM

## 2020-04-25 DIAGNOSIS — I10 Essential (primary) hypertension: Secondary | ICD-10-CM | POA: Diagnosis not present

## 2020-04-25 DIAGNOSIS — R918 Other nonspecific abnormal finding of lung field: Secondary | ICD-10-CM

## 2020-04-25 MED ORDER — VALSARTAN 160 MG PO TABS: 160.0000 mg | ORAL_TABLET | Freq: Every day | ORAL | 2 refills | Status: DC

## 2020-04-25 NOTE — Assessment & Plan Note (Signed)
Suspect this was viral pneumonitis. Infiltrates have resolved. If return then we would need to consider possible FOB to eval for other causes

## 2020-04-25 NOTE — Assessment & Plan Note (Signed)
On her CT chest. I think she will need cards referral for risk stratification as well as rx of her HTN

## 2020-04-25 NOTE — Patient Instructions (Addendum)
We will stop your lisinopril Please start valsartan 160 mg once daily Please continue your fluticasone nasal spray, loratadine and omeprazole as you have been taking them. Keep albuterol available to use 2 puffs if needed for shortness of breath, coughing spells We will refer you to see cardiology to be evaluated for high blood pressure and possible coronary disease based on your CT chest Follow with Dr. Delton Coombes in 2 months or sooner if you have any problems.

## 2020-04-25 NOTE — Addendum Note (Signed)
Addended by: Dorisann Frames R on: 04/25/2020 02:13 PM   Modules accepted: Orders

## 2020-04-25 NOTE — Progress Notes (Signed)
Subjective:    Patient ID: Tami Burke, female    DOB: 02-26-1955, 65 y.o.   MRN: [id number]  HPI 65 year old never smoker with a history of hypertension, hypothyroidism, remote lower extremity DVT on OCP, not currently on anticoagulation.  Here to evaluate chronic cough.   Seen by Pulmonology at [facility] 11/29/2019 for was deemed to be post infectious chronic cough following respiratory infection 08/2019 that included fever, chills, cough, dyspnea.  CT chest 09/27/2019 reviewed by me showed no PE, patchy and groundglass left lower lobe opacities with some slight consolidation, as well as minimal patchy opacity in the medial right lower lobe most consistent with pneumonia.  Her COVID-19 was negative at the time.  There was a 3 mm right superior segmental pulmonary nodule that was stable going back to 2009.  She was treated with antibiotics and 5-day course of corticosteroids in September.  She has continued to have cough and has been treated with antibiotics x2, prednisone x2.   She describes persistent cough, sometimes paroxysms that result in emesis. Sometimes prod of clear / white, never blood, sometimes dry. She has a change in her voice quality, sometimes hoarse and loses her voice.  She is also experiencing . She has some low level nasal and sinus congestion. Denies any GERD sx.   Repeat chest x-ray 01/02/2020 reviewed by me showed no infiltrates  ROV 02/27/20 --follow-up visit for never smoker under evaluation for chronic cough that began following apparent upper respiratory infection in August 2021.  We started omeprazole, loratadine and discussed strategies to avoid upper airway irritation. She is not having GERD, has taken the omeprazole and loratadine. Still with some nasal gtt.   Chest x-ray done 01/30/2020 reviewed by me, shows no infiltrates. Pulmonary function testing done today shows evidence for possible mixed restriction and obstruction without a positive bronchodilator response,  normal lung volumes and normal diffusion capacity.  The curve of the flow-volume loop is normal  ROV 04/25/20 --65 year old woman, never smoker who follows up today for her chronic cough.  She has PFTs with possible mixed restriction and obstruction with no positive bronchodilator response consistent with asthma.  She had some patchy groundglass on CT chest 09/27/2019 when her cough was starting in the setting of viral process, treated empirically with antibiotics and prednisone.  Her COVID-19 was negative. Last time I added fluticasone nasal spray to her loratadine, continued omeprazole and cough suppression with Tessalon.  I did not add a bronchodilator.  We repeated her CT scan of the chest as below.  She is on lisinopril starting several months ago. She is using albuterol, and it seems to help her cough.  She had coronary calcifications on the CT >> needs to see cardiology.   CT chest performed 03/13/2020 reviewed by me, shows interval resolution of her previously identified inflammatory airspace disease.  There is no effusion or pneumothorax.  MDM:  Reviewed NSGY note from 04/23/20, 01/31/20   Review of Systems As per HPI  Past Medical History:  Diagnosis Date  . DVT, lower extremity (HCC)   . Hypertension   . Thyroid disease    hypothyroidism     Family History  Problem Relation Age of Onset  . Cancer Mother        colon/rectal  . Heart disease Mother   . Heart disease Maternal Grandmother      Social History   Socioeconomic History  . Marital status: Married    Spouse name: Not on file  . Number of  children: Not on file  . Years of education: Not on file  . Highest education level: Not on file  Occupational History  . Not on file  Tobacco Use  . Smoking status: Never Smoker  . Smokeless tobacco: Never Used  Substance and Sexual Activity  . Alcohol use: No  . Drug use: No  . Sexual activity: Not on file  Other Topics Concern  . Not on file  Social History Narrative   . Not on file   Social Determinants of Health   Financial Resource Strain: Not on file  Food Insecurity: Not on file  Transportation Needs: Not on file  Physical Activity: Not on file  Stress: Not on file  Social Connections: Not on file  Intimate Partner Violence: Not on file  No occupational exposures No birds No fungal exposure  Allergies  Allergen Reactions  . Penicillins Hives, Itching and Swelling     Outpatient Medications Prior to Visit  Medication Sig Dispense Refill  . albuterol (VENTOLIN HFA) 108 (90 Base) MCG/ACT inhaler Inhale 1-2 puffs into the lungs every 6 (six) hours as needed for wheezing or shortness of breath.    . benzonatate (TESSALON) 100 MG capsule Take 1 capsule (100 mg total) by mouth every 6 (six) hours as needed for cough. 30 capsule 1  . Calcium Carb-Cholecalciferol (CALCIUM 1000 + D) 1000-800 MG-UNIT TABS Take 1 tablet by mouth daily.    . fluticasone (FLONASE) 50 MCG/ACT nasal spray Place 1 spray into both nostrils daily. 16 g 2  . levothyroxine (SYNTHROID) 100 MCG tablet Take by mouth.    Marland Kitchen lisinopril (ZESTRIL) 10 MG tablet Take by mouth.    . loratadine (CLARITIN) 10 MG tablet Take 1 tablet (10 mg total) by mouth daily. 30 tablet 11  . omeprazole (PRILOSEC) 20 MG capsule Take 1 capsule (20 mg total) by mouth 2 (two) times daily before a meal. 30 capsule 11   No facility-administered medications prior to visit.         Objective:   Physical Exam  Vitals:   04/25/20 1143  BP: 112/70  Pulse: 96  Temp: (!) 97.1 F (36.2 C)  TempSrc: Temporal  SpO2: 96%  Weight: 183 lb 9.6 oz (83.3 kg)  Height: 5\' 4"  (1.626 m)   Gen: Pleasant, overwt woman, in no distress,  normal affect  ENT: No lesions,  mouth clear,  oropharynx clear, no postnasal drip, slightly hoarse voice  Neck: No JVD, no stridor  Lungs: No use of accessory muscles, no crackles or wheezing on normal respiration, no wheeze on forced expiration but she does  cough  Cardiovascular: RRR, early syst M, no peripheral edema  Musculoskeletal: No deformities, no cyanosis or clubbing  Neuro: alert, awake, non focal  Skin: Warm, no lesions or rash     Assessment & Plan:  Chronic cough With contributions from GERD, rhinitis. We've treated both. In the interim she has been started on lisinopril which may be contributing to, sustaining her UA cough. There is some question of possible obstruction / mixed disease, and she feels some benefit from albuterol. She may benefit from scheduled BD at some point, although I don't want to change more than one variable yet.   We will stop your lisinopril Please start valsartan 160 mg once daily Please continue your fluticasone nasal spray, loratadine and omeprazole as you have been taking them. Keep albuterol available to use 2 puffs if needed for shortness of breath, coughing spells Follow with Dr. in  2 months or sooner if you have any problems.   Pulmonary infiltrates Suspect this was viral pneumonitis. Infiltrates have resolved. If return then we would need to consider possible FOB to eval for other causes   Coronary artery calcification On her CT chest. I think she will need cards referral for risk stratification as well as rx of her HTN  Levy Pupa, MD, PhD 04/25/2020, 1:26 PM [facility] [phone] or if no answer [phone]

## 2020-04-25 NOTE — Assessment & Plan Note (Signed)
With contributions from GERD, rhinitis. We've treated both. In the interim she has been started on lisinopril which may be contributing to, sustaining her UA cough. There is some question of possible obstruction / mixed disease, and she feels some benefit from albuterol. She may benefit from scheduled BD at some point, although I don't want to change more than one variable yet.   We will stop your lisinopril Please start valsartan 160 mg once daily Please continue your fluticasone nasal spray, loratadine and omeprazole as you have been taking them. Keep albuterol available to use 2 puffs if needed for shortness of breath, coughing spells Follow with Dr. Delton Coombes in 2 months or sooner if you have any problems.

## 2020-05-03 ENCOUNTER — Telehealth: Payer: Self-pay | Admitting: Emergency Medicine

## 2020-05-03 DIAGNOSIS — I10 Essential (primary) hypertension: Secondary | ICD-10-CM

## 2020-05-03 NOTE — Telephone Encounter (Signed)
RB can we send a referral to a cardiologist in [place]?  Pt called and  Requested this.  Please advise. Thanks

## 2020-05-03 NOTE — Telephone Encounter (Signed)
Yes, ok to change the referral to whomever she prefers.

## 2020-05-03 NOTE — Telephone Encounter (Signed)
I have called and spoke with pt and she is wanting to be referred to cardiology in [place] at [facility].  She is aware of the referral that has been sent.  She will call tomorrow to cancel her appt with cardiology in [place].

## 2020-05-08 ENCOUNTER — Ambulatory Visit: Payer: Medicare Other | Admitting: Cardiology

## 2020-05-09 ENCOUNTER — Ambulatory Visit: Payer: Medicare Other | Admitting: Cardiology

## 2020-08-04 ENCOUNTER — Other Ambulatory Visit: Payer: Self-pay | Admitting: Emergency Medicine

## 2021-01-30 ENCOUNTER — Other Ambulatory Visit: Payer: Self-pay | Admitting: Emergency Medicine

## 2021-06-19 ENCOUNTER — Other Ambulatory Visit: Payer: Self-pay | Admitting: Surgery

## 2021-06-19 DIAGNOSIS — R1032 Left lower quadrant pain: Secondary | ICD-10-CM
